# Patient Record
Sex: Female | Born: 1990 | Race: Black or African American | Hispanic: No | Marital: Single | Smoking: Never smoker
Health system: Southern US, Community
[De-identification: ages and names within clinical notes are randomized; demographics above are authoritative.]

## PROBLEM LIST (undated history)

## (undated) ENCOUNTER — Inpatient Hospital Stay (HOSPITAL_COMMUNITY): Payer: Self-pay

## (undated) DIAGNOSIS — O24419 Gestational diabetes mellitus in pregnancy, unspecified control: Secondary | ICD-10-CM

## (undated) DIAGNOSIS — Z789 Other specified health status: Secondary | ICD-10-CM

## (undated) DIAGNOSIS — A5901 Trichomonal vulvovaginitis: Secondary | ICD-10-CM

## (undated) DIAGNOSIS — A749 Chlamydial infection, unspecified: Secondary | ICD-10-CM

## (undated) HISTORY — PX: NO PAST SURGERIES: SHX2092

---
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
## 2012-01-14 IMAGING — US US TRANSVAGINAL NON-OB
1 series · 14 of 25 positions shown · non-contrast
Comparison: 01/19/2010

CLINICAL DATA: Pelvic pain.  Negative pregnancy test.



[Series 1: us ob comp less 14 wks · 14 of 47 slices shown]
[im 1/47]
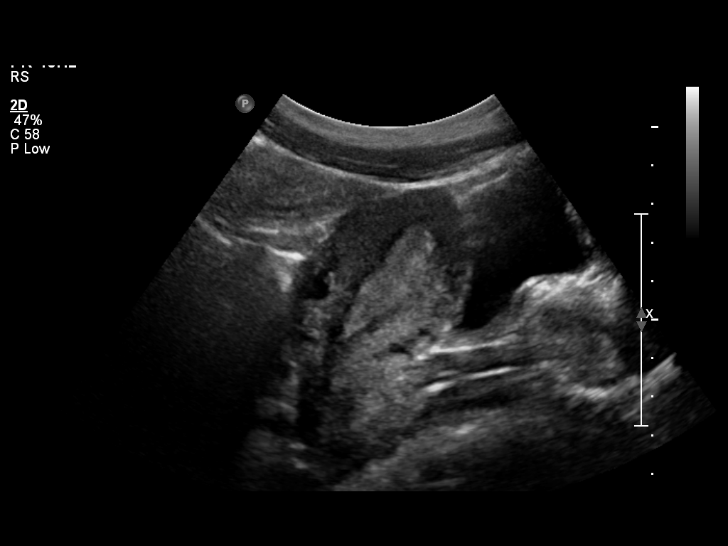
[im 4/47]
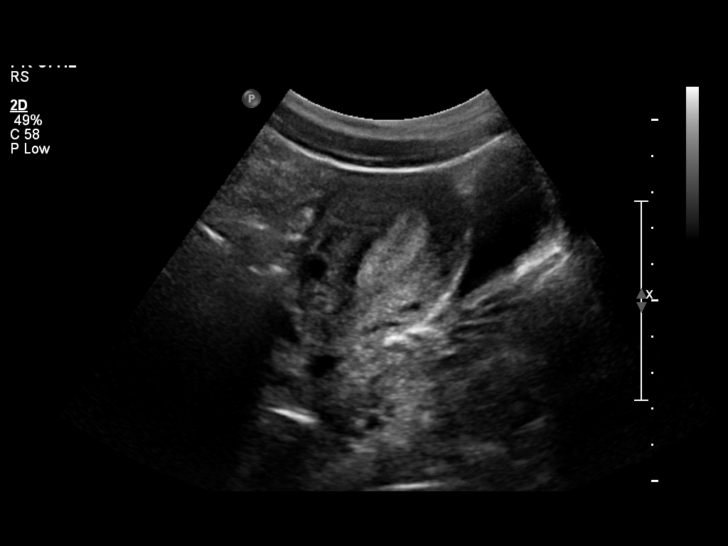
[im 8/47]
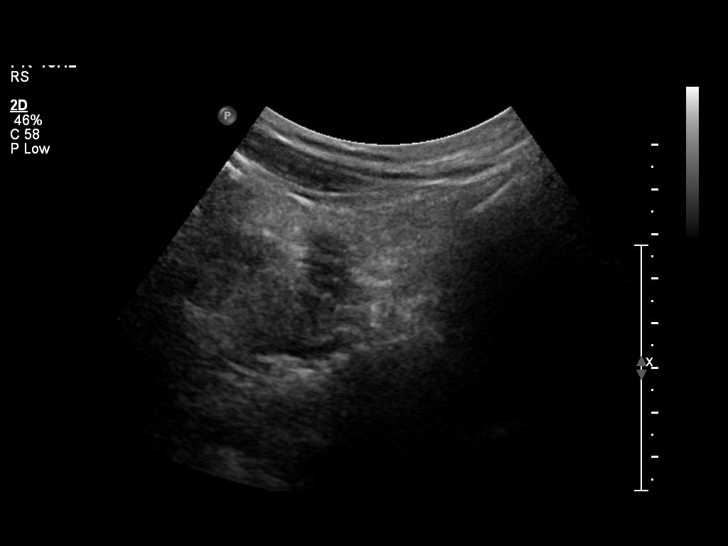
[im 12/47]
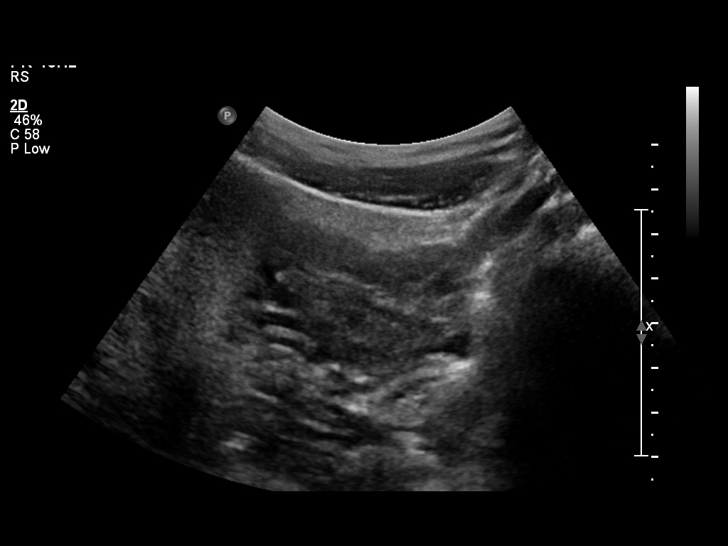
[im 16/47]
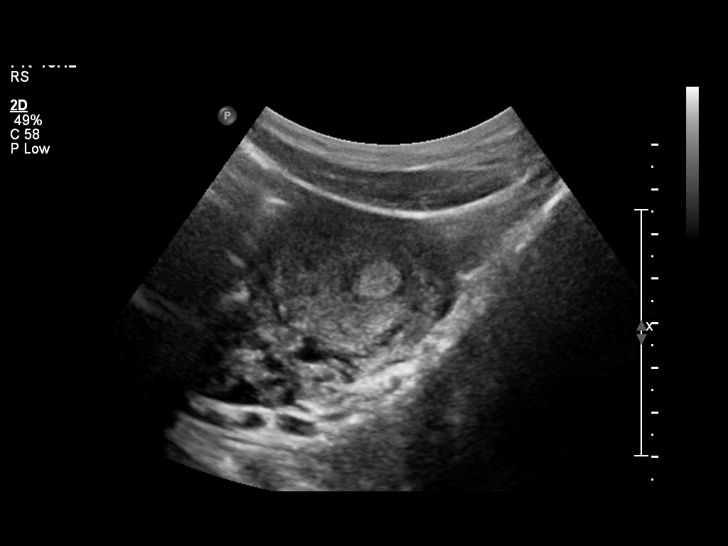
[im 18/47]
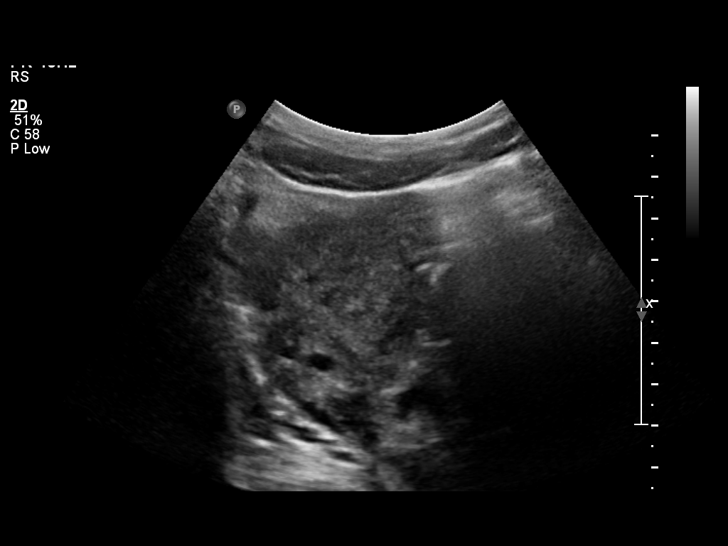
[im 22/47]
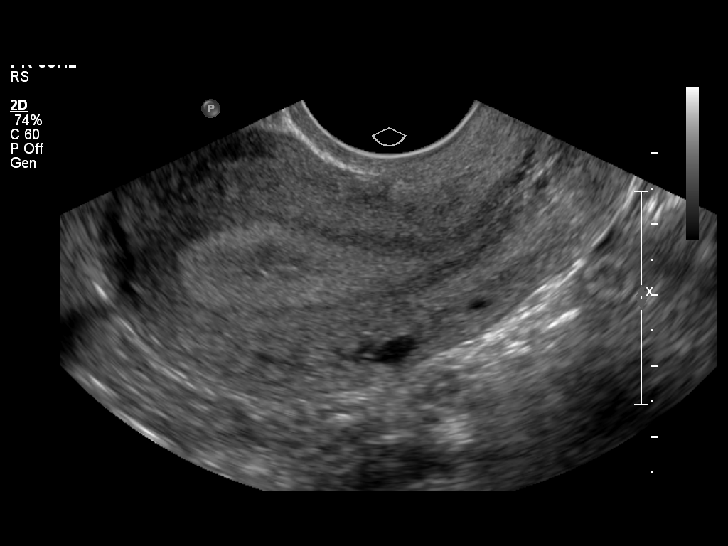
[im 25/47]
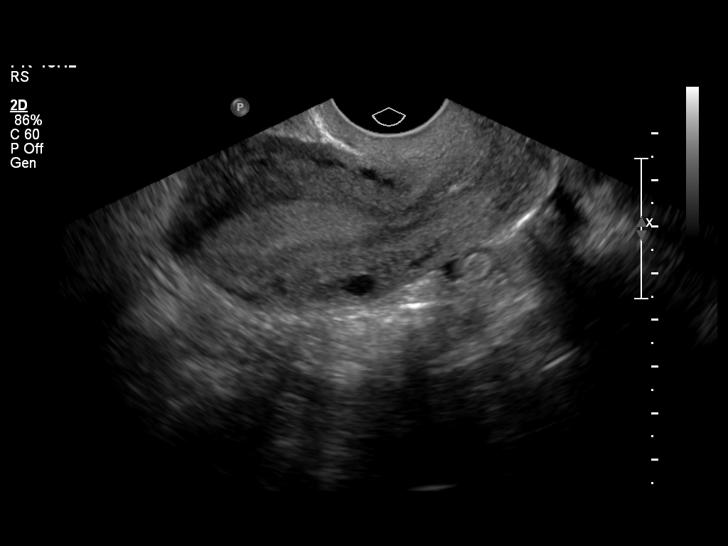
[im 29/47]
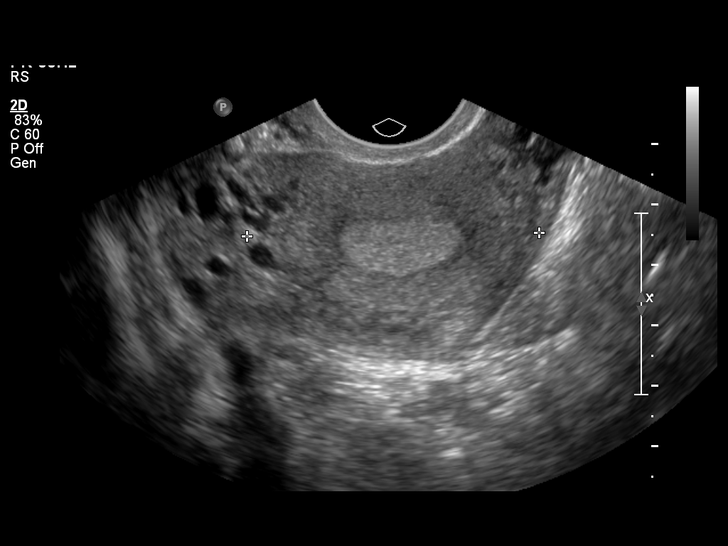
[im 31/47]
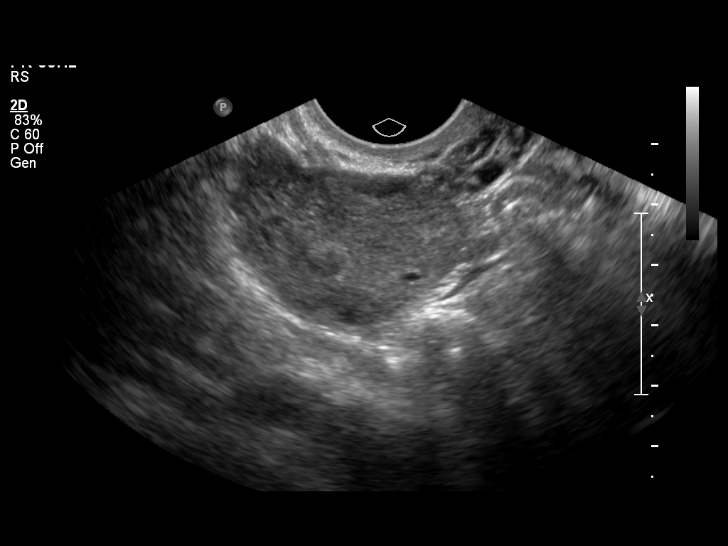
[im 35/47]
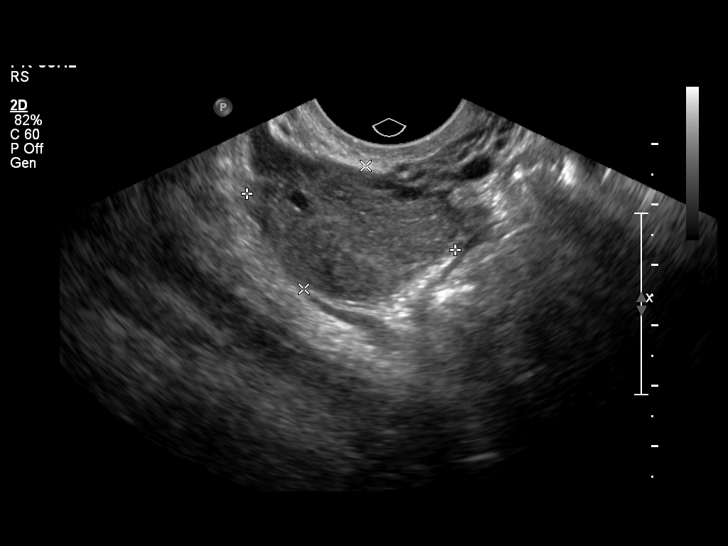
[im 39/47]
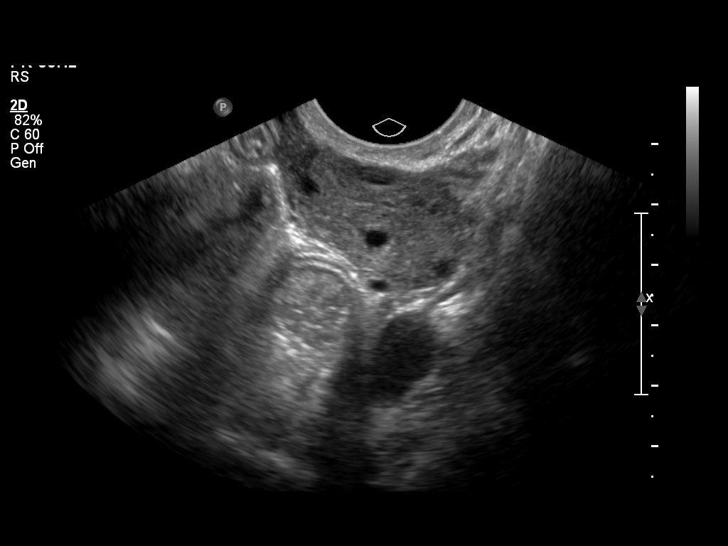
[im 43/47]
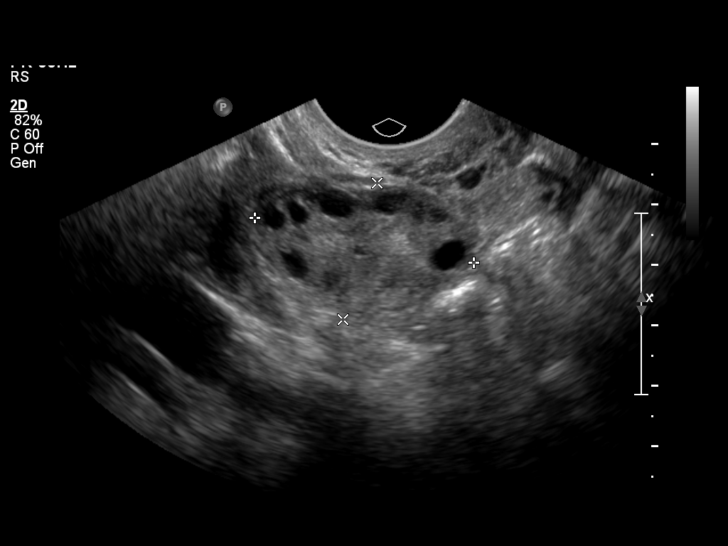
[im 47/47]
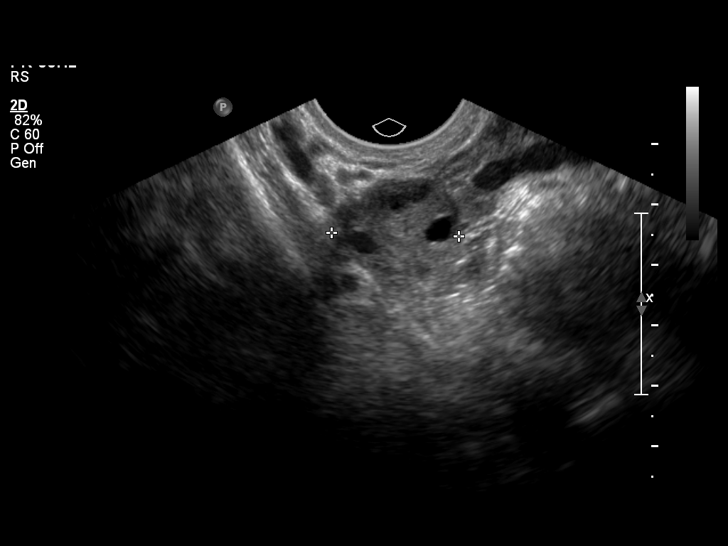

[14 of 25 positions shown; findings below may reference images not displayed]

FINDINGS: Uterus:  The uterus measures 8.5 x 3.8 x 4.8 cm.  No mass or
fibroid.

Endometrium: Normal appearance of the endometrium measuring 6 mm.

Right ovary: The right ovary is normal.  No adnexal mass.  The
right ovary measures 3.7 x 2.3 x 2.1 cm.

Left ovary: The left ovary is normal measuring 3.6 x 2.3 x 2.9 cm.
No mass identified.

Other Findings:  No free fluid
IMPRESSION: 1.  Normal exam.
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
## 2013-05-12 ENCOUNTER — Emergency Department (HOSPITAL_COMMUNITY)
Admission: EM | Admit: 2013-05-12 | Discharge: 2013-05-12 | Disposition: A | Discharge status: Home / Self Care | Payer: Self-pay | Attending: Emergency Medicine | Admitting: Emergency Medicine

## 2013-05-12 ENCOUNTER — Encounter (HOSPITAL_COMMUNITY): Payer: Self-pay | Admitting: Emergency Medicine

## 2013-05-12 DIAGNOSIS — R109 Unspecified abdominal pain: Secondary | ICD-10-CM

## 2013-05-12 DIAGNOSIS — R1084 Generalized abdominal pain: Secondary | ICD-10-CM | POA: Insufficient documentation

## 2013-05-12 DIAGNOSIS — Z79899 Other long term (current) drug therapy: Secondary | ICD-10-CM | POA: Insufficient documentation

## 2013-05-12 DIAGNOSIS — Z3202 Encounter for pregnancy test, result negative: Secondary | ICD-10-CM | POA: Insufficient documentation

## 2013-05-12 DIAGNOSIS — N898 Other specified noninflammatory disorders of vagina: Secondary | ICD-10-CM | POA: Insufficient documentation

## 2013-05-12 LAB — URINALYSIS, ROUTINE W REFLEX MICROSCOPIC
Bilirubin Urine: NEGATIVE
Glucose, UA: NEGATIVE mg/dL
Ketones, ur: NEGATIVE mg/dL
Urobilinogen, UA: 1 mg/dL (ref 0.0–1.0)
pH: 6 (ref 5.0–8.0)

## 2013-05-12 NOTE — ED Provider Notes (Signed)
CSN: [id number]     Arrival date & time 05/12/13  1333 History   First MD Initiated Contact with Patient 05/12/13 1348     Chief Complaint  Patient presents with  . Abdominal Pain    Patient is a 22 y.o. female presenting with abdominal pain. The history is provided by the patient.  Abdominal Pain Pain location:  Generalized Pain quality: cramping   Pain radiates to:  Does not radiate Pain severity:  Mild Duration: several weeks ago. Timing:  Intermittent Progression:  Unchanged Relieved by:  Nothing Worsened by:  Nothing tried Associated symptoms: vaginal bleeding   Associated symptoms: no fever   pt reports she had positive pregnancy test several weeks ago (LMP - 7/23) She reports that she had some vaginal spotting/bleeding and abd cramping for several weeks She now presents for evaluation She has not had any other evaluations No other complaints reported  Past Medical History  Diagnosis Date  . No pertinent past medical history    Past Surgical History  Procedure Laterality Date  . No past surgeries     Family History  Problem Relation Age of Onset  . Hypertension Mother   . Diabetes Mother   . Cancer Mother   . Cancer Father    History  Substance Use Topics  . Smoking status: Never Smoker   . Smokeless tobacco: Never Used  . Alcohol Use: No   OB History   Grav Para Term Preterm Abortions TAB SAB Ect Mult Living   2 1        1      Review of Systems  Constitutional: Negative for fever.  Gastrointestinal: Positive for abdominal pain.  Genitourinary: Positive for vaginal bleeding.  Neurological: Negative for weakness.  All other systems reviewed and are negative.    Allergies  Review of patient's allergies indicates no known allergies.  Home Medications   Current Outpatient Rx  Name  Route  Sig  Dispense  Refill  . naproxen sodium (ALEVE) 220 MG tablet   Oral   Take 440 mg by mouth daily as needed (for pain).          BP 115/74  Pulse 86   Temp(Src) 99 F (37.2 C) (Oral)  Resp 16  SpO2 100% Physical Exam CONSTITUTIONAL: Well developed/well nourished HEAD: Normocephalic/atraumatic EYES: EOMI/PERRL ENMT: Mucous membranes moist NECK: supple no meningeal signs SPINE:entire spine nontender CV: S1/S2 noted, no murmurs/rubs/gallops noted LUNGS: Lungs are clear to auscultation bilaterally, no apparent distress ABDOMEN: soft, nontender, no rebound or guarding GU:no cva tenderness No vag bleeding.  Os closed.  No products of conception.  No adnexal tenderness or mass noted Chaperone present NEURO: Pt is awake/alert, moves all extremitiesx4 EXTREMITIES: pulses normal, full ROM SKIN: warm, color normal PSYCH: no abnormalities of mood noted  ED Course  Procedures (including critical care time) Labs Review Labs Reviewed  URINALYSIS, ROUTINE W REFLEX MICROSCOPIC    Pt not pregnant here (had postiive test weeks ago) I doubt acute gyn/abdominal emergency  I doubt ectopic pregnancy Stable for d/c home  MDM  No diagnosis found. Nursing notes including past medical history and social history reviewed and considered in documentation Labs/vital reviewed and considered     Joya Gaskins, MD 05/12/13 [phone]

## 2013-05-12 NOTE — ED Notes (Signed)
MD verbal that pt not pregant

## 2013-05-12 NOTE — ED Notes (Signed)
Pt c/o lower abd cramping and vaginal spotting x several days; pt sts + home pregnancy test and LMP was in June; pt sts G3 P2

## 2013-05-12 NOTE — ED Notes (Signed)
MD at bedside. 

## 2014-06-28 ENCOUNTER — Encounter (HOSPITAL_COMMUNITY): Payer: Self-pay | Admitting: Emergency Medicine

## 2014-10-18 ENCOUNTER — Other Ambulatory Visit (HOSPITAL_COMMUNITY): Payer: Self-pay | Admitting: Urology

## 2014-10-18 DIAGNOSIS — Z3689 Encounter for other specified antenatal screening: Secondary | ICD-10-CM

## 2014-11-08 ENCOUNTER — Ambulatory Visit (HOSPITAL_COMMUNITY)
Admission: RE | Admit: 2014-11-08 | Discharge: 2014-11-08 | Disposition: A | Discharge status: Home / Self Care | Payer: Medicaid Other | Source: Ambulatory Visit | Attending: Urology | Admitting: Urology

## 2014-11-08 DIAGNOSIS — Z36 Encounter for antenatal screening of mother: Secondary | ICD-10-CM | POA: Insufficient documentation

## 2014-11-08 DIAGNOSIS — Z3A19 19 weeks gestation of pregnancy: Secondary | ICD-10-CM | POA: Insufficient documentation

## 2014-11-08 DIAGNOSIS — Z3689 Encounter for other specified antenatal screening: Secondary | ICD-10-CM | POA: Insufficient documentation

## 2014-12-21 ENCOUNTER — Inpatient Hospital Stay (HOSPITAL_COMMUNITY)
Admission: AD | Admit: 2014-12-21 | Discharge: 2014-12-22 | Disposition: A | Discharge status: Home / Self Care | Payer: Medicaid Other | Source: Ambulatory Visit | Attending: Family Medicine | Admitting: Family Medicine

## 2014-12-21 ENCOUNTER — Encounter (HOSPITAL_COMMUNITY): Payer: Self-pay

## 2014-12-21 DIAGNOSIS — M7989 Other specified soft tissue disorders: Secondary | ICD-10-CM

## 2014-12-21 DIAGNOSIS — O9989 Other specified diseases and conditions complicating pregnancy, childbirth and the puerperium: Secondary | ICD-10-CM | POA: Insufficient documentation

## 2014-12-21 DIAGNOSIS — O98812 Other maternal infectious and parasitic diseases complicating pregnancy, second trimester: Secondary | ICD-10-CM

## 2014-12-21 DIAGNOSIS — O212 Late vomiting of pregnancy: Secondary | ICD-10-CM | POA: Insufficient documentation

## 2014-12-21 DIAGNOSIS — K30 Functional dyspepsia: Secondary | ICD-10-CM

## 2014-12-21 DIAGNOSIS — A749 Chlamydial infection, unspecified: Secondary | ICD-10-CM | POA: Clinically undetermined

## 2014-12-21 DIAGNOSIS — O219 Vomiting of pregnancy, unspecified: Secondary | ICD-10-CM | POA: Diagnosis not present

## 2014-12-21 DIAGNOSIS — R1900 Intra-abdominal and pelvic swelling, mass and lump, unspecified site: Secondary | ICD-10-CM | POA: Insufficient documentation

## 2014-12-21 DIAGNOSIS — Z3A25 25 weeks gestation of pregnancy: Secondary | ICD-10-CM | POA: Insufficient documentation

## 2014-12-21 DIAGNOSIS — O23592 Infection of other part of genital tract in pregnancy, second trimester: Secondary | ICD-10-CM

## 2014-12-21 DIAGNOSIS — R51 Headache: Secondary | ICD-10-CM | POA: Insufficient documentation

## 2014-12-21 DIAGNOSIS — A5901 Trichomonal vulvovaginitis: Secondary | ICD-10-CM | POA: Clinically undetermined

## 2014-12-21 MED ORDER — LACTATED RINGERS IV BOLUS (SEPSIS)
1000.0000 mL | Freq: Once | INTRAVENOUS | Status: AC
  Administered 2014-12-22: 1000 mL via INTRAVENOUS

## 2014-12-21 MED ORDER — RANITIDINE HCL 150 MG PO TABS: 150.0000 mg | ORAL_TABLET | Freq: Two times a day (BID) | ORAL | Status: AC

## 2014-12-21 MED ORDER — METOCLOPRAMIDE HCL 5 MG/ML IJ SOLN
10.0000 mg | Freq: Once | INTRAMUSCULAR | Status: AC
  Administered 2014-12-22: 10 mg via INTRAVENOUS

## 2014-12-21 MED ORDER — PROMETHAZINE HCL 25 MG/ML IJ SOLN
12.5000 mg | Freq: Once | INTRAMUSCULAR | Status: AC
  Administered 2014-12-22: 12.5 mg via INTRAVENOUS
  Filled 2014-12-21: qty 1

## 2014-12-21 NOTE — MAU Provider Note (Signed)
History     CSN: [id number]  Arrival date and time: 12/21/14 2241   None     Chief Complaint  Patient presents with  . Mass  . Headache   HPI Patient is 24 y.o. [id number] [redacted]w[redacted]d here with complaints of headache since she woke up this am.  She describes the headache as achy and located behind her eyes wrapping around to each temple.  No h/o migraine headache.  Endorses dizziness like she has gotten up to fast and wants to fall back down.  Has been going on all day.  Took tylenol at 3pm which did not help at all.  Denies blurry vision.  Endorses vomiting since 2 days ago.  Fried and spicy foods make her nauseated.  She is unable to keep these down.  Denies diarrhea.  Endorses heartburn.  She has used Tums with no relief.  In addition, she reports a lump on her L side that she noticed 2 days ago.  She reports that it is TTP.  Denies fevers, injury, exudate, itching from lesion.  +FM, denies LOF, VB, contractions, vaginal discharge.  OB History    Gravida Para Term Preterm AB TAB SAB Ectopic Multiple Living   Past Medical History  Diagnosis Date  . No pertinent past medical history     Past Surgical History  Procedure Laterality Date  . No past surgeries      Family History  Problem Relation Age of Onset  . Hypertension Mother   . Diabetes Mother   . Cancer Mother   . Cancer Father     History  Substance Use Topics  . Smoking status: Never Smoker   . Smokeless tobacco: Never Used  . Alcohol Use: No    Allergies: No Known Allergies  No prescriptions prior to admission    Review of Systems  Constitutional: Negative for fever and chills.  HENT: Negative for congestion and sore throat.   Eyes: Negative for blurred vision, double vision and photophobia.  Respiratory: Negative for cough, shortness of breath and wheezing.   Cardiovascular: Negative for chest pain and palpitations.  Gastrointestinal: Positive for heartburn, nausea and vomiting.  Negative for diarrhea.  Genitourinary: Negative for dysuria, urgency, frequency and hematuria.  Musculoskeletal: Negative for falls.  Skin:       Lump on her L side  Neurological: Positive for dizziness and headaches.    Physical Exam   Blood pressure 98/52, pulse 80, temperature 98.3 F (36.8 C), temperature source Oral, resp. rate 16, last menstrual period 06/25/2014.  Physical Exam  Constitutional: She is oriented to person, place, and time. She appears well-developed and well-nourished. No distress.  HENT:  Head: Normocephalic and atraumatic.  Eyes: EOM are normal. Pupils are equal, round, and reactive to light. No scleral icterus.  Neck: Normal range of motion. Neck supple.  Cardiovascular: Normal rate, regular rhythm, normal heart sounds and intact distal pulses.   Respiratory: Effort normal and breath sounds normal. She has no wheezes.  GI: Soft. There is no tenderness.  gravid  Musculoskeletal: Normal range of motion. She exhibits no edema or tenderness.  Lymphadenopathy:    She has no cervical adenopathy.  Neurological: She is alert and oriented to person, place, and time. Coordination normal.  Skin: Skin is warm and dry. No rash noted.  0.75x1.5cm mass on L flank at level of iliac crest, non indurated, nonerythematous, +mild TTP   Fetal monitoringBaseline:  145 bpm and Variability: Good {> 6 bpm) Uterine activityNone  Results for orders placed or performed during the hospital encounter of 12/21/14 (from the past 24 hour(s))  Urinalysis, Routine w reflex microscopic     Status: None   Collection Time: 12/21/14 11:05 PM  Result Value Ref Range   Color, Urine YELLOW YELLOW   APPearance CLEAR CLEAR   Specific Gravity, Urine 1.020 1.005 - 1.030   pH 7.0 5.0 - 8.0   Glucose, UA NEGATIVE NEGATIVE mg/dL   Hgb urine dipstick NEGATIVE NEGATIVE   Bilirubin Urine NEGATIVE NEGATIVE   Ketones, ur NEGATIVE NEGATIVE mg/dL   Protein, ur NEGATIVE NEGATIVE mg/dL   Urobilinogen, UA  0.2 0.0 - 1.0 mg/dL   Nitrite NEGATIVE NEGATIVE   Leukocytes, UA NEGATIVE NEGATIVE     MAU Course  Procedures  MDM NST reassuring UA unremarkable LR x1L Reglan, Promethazine IV  Assessment and Plan  Patient's nausea and headache improved with IVF and medication.  She was discharged in stable condition.  Mass on L side likely a lipoma vs cyst -Reassurance. -Instructed patient to discuss this with PCP/OB.  Would recommend that it be monitored for enlargement. -Return precautions discussed with patient  Nausea and vomiting during pregnancy.  Indigestion induced vs emesis of pregnancy -Instructed to avoid emesis causing foods -Zantac BID  Delynn FlavinGottschalk, [name], DO 12/22/2014, 2:24 AM   Patient seen also by me I agree with the written note above. Recommend following the soft tissue mass for growth over time Will treat nausea Follow up in clinic  Aviva SignsMarie L Amela Handley, CNM

## 2014-12-21 NOTE — MAU Note (Signed)
Has a "growth" on the left side of her abdomen x 2 days, that has grown in size and is tender. Headache all day today. Denies vaginal bleeding/LOF/contractions. Positive fetal movement.

## 2014-12-22 DIAGNOSIS — O219 Vomiting of pregnancy, unspecified: Secondary | ICD-10-CM | POA: Diagnosis not present

## 2014-12-22 LAB — URINALYSIS, ROUTINE W REFLEX MICROSCOPIC
BILIRUBIN URINE: NEGATIVE
GLUCOSE, UA: NEGATIVE mg/dL
HGB URINE DIPSTICK: NEGATIVE
Ketones, ur: NEGATIVE mg/dL
Leukocytes, UA: NEGATIVE
Nitrite: NEGATIVE
PH: 7 (ref 5.0–8.0)
Protein, ur: NEGATIVE mg/dL
SPECIFIC GRAVITY, URINE: 1.02 (ref 1.005–1.030)
Urobilinogen, UA: 0.2 mg/dL (ref 0.0–1.0)

## 2014-12-22 NOTE — Discharge Instructions (Signed)
Indigestion  Indigestion is discomfort in the upper belly (abdomen). HOME CARE  Avoid foods and drinks that make your problems worse. You may want to avoid:  Caffeine and alcohol.  Chocolate.  Peppermint.  Garlic and onions.  Spicy foods.  Citrus fruits, such as oranges, lemons, or limes.  Tomato-based foods such as sauce, chili, salsa, and pizza.  Fried and fatty foods.  Avoid eating for 3 hours before your bedtime.  Eat small meals instead of large meals more often.  Stop smoking if you smoke.  Maintain a healthy weight.  Wear loose-fitting clothing. Do not wear anything tight around your waist.  Raise the head of your bed 4 to 8 inches with wood blocks.  Only take medicines as told by your doctor.  Do not take aspirin or ibuprofen. GET HELP RIGHT AWAY IF:  You are not better after 2 days.  You have chest pain that goes into your neck, arms, back, jaw, or upper belly.  You have trouble swallowing.  You keep throwing up (vomiting).  You have black or bloody poop (stool).  You have a fever.  You have trouble breathing, you feel dizzy, or you pass out (faint).  You are sweating a lot.  You have severe belly pain.  You lose weight without trying. MAKE SURE YOU:  Understand these instructions.  Will watch your condition.  Will get help right away if you are not doing well or get worse. Document Released: 09/15/2010 Document Revised: 11/05/2011 Document Reviewed: 03/28/2011 [facility] Patient Information 2015 [place], [place]. This information is not intended to replace advice given to you by your health care provider. Make sure you discuss any questions you have with your health care provider.  Morning Sickness Morning sickness is when you feel sick to your stomach (nauseous) during pregnancy. You may feel sick to your stomach and throw up (vomit). You may feel sick in the morning, but you can feel this way any time of day. Some women feel very sick to  their stomach and cannot stop throwing up (hyperemesis gravidarum). HOME CARE  Only take medicines as told by your doctor.  Take multivitamins as told by your doctor. Taking multivitamins before getting pregnant can stop or lessen the harshness of morning sickness.  Eat dry toast or unsalted crackers before getting out of bed.  Eat 5 to 6 small meals a day.  Eat dry and bland foods like rice and baked potatoes.  Do not drink liquids with meals. Drink between meals.  Do not eat greasy, fatty, or spicy foods.  Have someone cook for you if the smell of food causes you to feel sick or throw up.  If you feel sick to your stomach after taking prenatal vitamins, take them at night or with a snack.  Eat protein when you need a snack (nuts, yogurt, cheese).  Eat unsweetened gelatins for dessert.  Wear a bracelet used for sea sickness (acupressure wristband).  Go to a doctor that puts thin needles into certain body points (acupuncture) to improve how you feel.  Do not smoke.  Use a humidifier to keep the air in your house free of odors.  Get lots of fresh air. GET HELP IF:  You need medicine to feel better.  You feel dizzy or lightheaded.  You are losing weight. GET HELP RIGHT AWAY IF:   You feel very sick to your stomach and cannot stop throwing up.  You pass out (faint). MAKE SURE YOU:  Understand these instructions.  Will  watch your condition.  Will get help right away if you are not doing well or get worse. Document Released: 09/20/2004 Document Revised: 08/18/2013 Document Reviewed: 01/28/2013 [facility] Patient Information 2015 [place]. This information is not intended to replace advice given to you by your health care provider. Make sure you discuss any questions you have with your health care provider.

## 2015-01-14 ENCOUNTER — Encounter (HOSPITAL_COMMUNITY): Payer: Self-pay | Admitting: *Deleted

## 2015-01-14 ENCOUNTER — Inpatient Hospital Stay (HOSPITAL_COMMUNITY)
Admission: AD | Admit: 2015-01-14 | Discharge: 2015-01-14 | Disposition: A | Discharge status: Home / Self Care | Payer: Medicaid Other | Source: Ambulatory Visit | Attending: Family Medicine | Admitting: Family Medicine

## 2015-01-14 DIAGNOSIS — O9989 Other specified diseases and conditions complicating pregnancy, childbirth and the puerperium: Secondary | ICD-10-CM | POA: Diagnosis not present

## 2015-01-14 DIAGNOSIS — Z3A29 29 weeks gestation of pregnancy: Secondary | ICD-10-CM | POA: Diagnosis not present

## 2015-01-14 DIAGNOSIS — R102 Pelvic and perineal pain: Secondary | ICD-10-CM | POA: Insufficient documentation

## 2015-01-14 DIAGNOSIS — O26899 Other specified pregnancy related conditions, unspecified trimester: Secondary | ICD-10-CM

## 2015-01-14 DIAGNOSIS — O24410 Gestational diabetes mellitus in pregnancy, diet controlled: Secondary | ICD-10-CM | POA: Insufficient documentation

## 2015-01-14 DIAGNOSIS — N949 Unspecified condition associated with female genital organs and menstrual cycle: Secondary | ICD-10-CM | POA: Diagnosis not present

## 2015-01-14 HISTORY — DX: Gestational diabetes mellitus in pregnancy, unspecified control: O24.419

## 2015-01-14 HISTORY — DX: Trichomonal vulvovaginitis: A59.01

## 2015-01-14 HISTORY — DX: Chlamydial infection, unspecified: A74.9

## 2015-01-14 LAB — URINALYSIS, ROUTINE W REFLEX MICROSCOPIC
BILIRUBIN URINE: NEGATIVE
Glucose, UA: NEGATIVE mg/dL
HGB URINE DIPSTICK: NEGATIVE
KETONES UR: 40 mg/dL — AB
Leukocytes, UA: NEGATIVE
Nitrite: NEGATIVE
PH: 6 (ref 5.0–8.0)
Protein, ur: NEGATIVE mg/dL
SPECIFIC GRAVITY, URINE: 1.02 (ref 1.005–1.030)
Urobilinogen, UA: 0.2 mg/dL (ref 0.0–1.0)

## 2015-01-14 MED ORDER — CYCLOBENZAPRINE HCL 5 MG PO TABS: 5.0000 mg | ORAL_TABLET | Freq: Three times a day (TID) | ORAL | Status: AC | PRN

## 2015-01-14 NOTE — Discharge Instructions (Signed)

## 2015-01-14 NOTE — MAU Provider Note (Signed)
History     CSN: [id number]  Arrival date and time: 01/14/15 1141   First Provider Initiated Contact with Patient 01/14/15 1319      Chief Complaint  Patient presents with  . Pelvic Pain   HPI Jenna Jensen 24 y.o.is a [id number] @[redacted]w[redacted]d  who presents via W/C with 2 hr hx of severe pelvic pain making it difficult to get out of bed. Pain onset was gradual; duration several days with several episodes of similar but less severe pain. Pain is located in  suprapubic and bilateral groin regions. Quality is sharp and crampy. Little relief from rest, inactivity or Tyleonl. Exacerbated by moving and walking.  Significant negatives: no contractions or upper abdominal pain. No dysuria or vaginal irritation.     [facility]; chlamydia and trich treated second trimester   OB Hx: uncomplicated NSVD x2   Past Medical History  Diagnosis Date  . No pertinent past medical history   . Gestational diabetes   . Chlamydia   . Vaginal trichomoniasis     Past Surgical History  Procedure Laterality Date  . No past surgeries      Family History  Problem Relation Age of Onset  . Hypertension Mother   . Diabetes Mother   . Cancer Mother   . Cancer Father     History  Substance Use Topics  . Smoking status: Never Smoker   . Smokeless tobacco: Never Used  . Alcohol Use: No    Allergies: No Known Allergies  Prescriptions prior to admission  Medication Sig Dispense Refill Last Dose  . Prenatal Vit-Fe Fumarate-FA (PRENATAL MULTIVITAMIN) TABS tablet Take 1 tablet by mouth daily at 12 noon.   01/14/2015 at Unknown time  . acetaminophen (TYLENOL) 325 MG tablet Take 650 mg by mouth every 6 (six) hours as needed for mild pain or headache.    prn  . ranitidine (ZANTAC) 150 MG tablet Take 1 tablet (150 mg total) by mouth 2 (two) times daily. (Patient not taking: Reported on 01/14/2015) 60 tablet 1 Not Taking at Unknown time    Review of Systems  Constitutional: Negative  for fever and chills.  Eyes: Negative for blurred vision.  Gastrointestinal: Negative for nausea and vomiting.  Genitourinary: Negative for dysuria, urgency, frequency, hematuria and flank pain.  Neurological: Positive for headaches.   Physical Exam   Blood pressure 120/85, pulse 115, temperature 98.3 F (36.8 C), temperature source Oral, resp. rate 20, height 5\' 4"  (1.626 m), weight 83.915 kg (185 lb), last menstrual period 06/25/2014.  Physical Exam  Nursing note and vitals reviewed. Constitutional: She appears well-developed and well-nourished. No distress.  HENT:  Head: Normocephalic.  Eyes: Pupils are equal, round, and reactive to light.  Neck: Normal range of motion.  Cardiovascular: Normal rate and regular rhythm.   Respiratory: Effort normal.  GI: There is tenderness.  Mild-mod TTP suprapubic and R>L groin   Dilation: Fingertip Cervical Position: Posterior Exam by:: Philipp DeputyKim Shaw, CNM    Cx recheck at 1320: closed/thick /-3  MAU Course  Procedures EFM: Baseline 140, 10 BPM accelerations, moderate variability, no decelerations Toco:No contractions  Results for orders placed or performed during the hospital encounter of 01/14/15 (from the past 24 hour(s))  Urinalysis, Routine w reflex microscopic     Status: Abnormal   Collection Time: 01/14/15  1:40 PM  Result Value Ref Range   Color, Urine YELLOW YELLOW   APPearance CLEAR CLEAR   Specific Gravity, Urine 1.020 1.005 - 1.030  pH 6.0 5.0 - 8.0   Glucose, UA NEGATIVE NEGATIVE mg/dL   Hgb urine dipstick NEGATIVE NEGATIVE   Bilirubin Urine NEGATIVE NEGATIVE   Ketones, ur 40 (A) NEGATIVE mg/dL   Protein, ur NEGATIVE NEGATIVE mg/dL   Urobilinogen, UA 0.2 0.0 - 1.0 mg/dL   Nitrite NEGATIVE NEGATIVE   Leukocytes, UA NEGATIVE NEGATIVE   MDM No evidence of UTI, vaginitis or PTL. Mild dehydration, but drinking now. Symptoms suggest RLP  Assessment and Plan   1. Pain of round ligament affecting pregnancy, antepartum    [id number] at 3358w0d FHR reassuring for GA  PLAN:   Medication List    TAKE these medications        acetaminophen 325 MG tablet  Commonly known as:  TYLENOL  Take 650 mg by mouth every 6 (six) hours as needed for mild pain or headache.     cyclobenzaprine 5 MG tablet  Commonly known as:  FLEXERIL  Take 1 tablet (5 mg total) by mouth 3 (three) times daily as needed for muscle spasms.     prenatal multivitamin Tabs tablet  Take 1 tablet by mouth daily at 12 noon.     ranitidine 150 MG tablet  Commonly known as:  ZANTAC  Take 1 tablet (150 mg total) by mouth 2 (two) times daily.       Follow-up Information    [facility]   Specialty:  Obstetrics and Gynecology   Why:  Keep your scheduled prenatal appointment   Contact information:   [address] [place] [id number] [phone]    Info on RLP and advised abdominal support binder   Meloni Hinz 01/14/2015, 1:19 PM
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
## 2015-01-14 NOTE — MAU Note (Signed)
Pt to MAU crying, unable to ambulate. Brought in by wheelchair from car. States she has been having pelvic pain for 2 hours. Denies vaginal bleeding. Denies fetal movement. Denies SROM/LOF. Gestational diabetes. Gets prenatal care at health dept.

## 2015-10-26 ENCOUNTER — Encounter (HOSPITAL_COMMUNITY): Payer: Self-pay | Admitting: *Deleted

## 2024-04-24 ENCOUNTER — Encounter: Payer: Self-pay | Admitting: Obstetrics & Gynecology

## 2024-05-04 ENCOUNTER — Telehealth: Payer: Self-pay

## 2024-05-04 ENCOUNTER — Other Ambulatory Visit: Payer: Self-pay | Admitting: Obstetrics & Gynecology

## 2024-05-04 DIAGNOSIS — Z3A33 33 weeks gestation of pregnancy: Secondary | ICD-10-CM

## 2024-05-04 DIAGNOSIS — O365931 Maternal care for other known or suspected poor fetal growth, third trimester, fetus 1: Secondary | ICD-10-CM

## 2024-05-04 DIAGNOSIS — O30043 Twin pregnancy, dichorionic/diamniotic, third trimester: Secondary | ICD-10-CM

## 2024-05-04 DIAGNOSIS — O4393 Unspecified placental disorder, third trimester: Secondary | ICD-10-CM
